# Patient Record
Sex: Male | Born: 1955
Health system: Southern US, Community
[De-identification: ages and names within clinical notes are randomized; demographics above are authoritative.]

## PROBLEM LIST (undated history)

## (undated) DIAGNOSIS — E039 Hypothyroidism, unspecified: Secondary | ICD-10-CM

## (undated) DIAGNOSIS — R030 Elevated blood-pressure reading, without diagnosis of hypertension: Secondary | ICD-10-CM

## (undated) DIAGNOSIS — E785 Hyperlipidemia, unspecified: Secondary | ICD-10-CM

## (undated) HISTORY — DX: Hyperlipidemia, unspecified: E78.5

## (undated) HISTORY — DX: Elevated blood-pressure reading, without diagnosis of hypertension: R03.0

## (undated) HISTORY — DX: Hypothyroidism, unspecified: E03.9

## (undated) HISTORY — PX: ELBOW ARTHROSCOPY: SHX614

---

## 1999-12-25 ENCOUNTER — Encounter: Payer: Self-pay | Admitting: Family Medicine

## 1999-12-25 ENCOUNTER — Ambulatory Visit (HOSPITAL_COMMUNITY): Admission: RE | Admit: 1999-12-25 | Discharge: 1999-12-25 | Payer: Self-pay | Admitting: Family Medicine

## 2006-06-27 ENCOUNTER — Ambulatory Visit: Payer: Self-pay | Admitting: Family Medicine

## 2006-06-27 ENCOUNTER — Encounter: Admission: RE | Admit: 2006-06-27 | Discharge: 2006-06-27 | Payer: Self-pay | Admitting: Family Medicine

## 2017-02-14 ENCOUNTER — Ambulatory Visit
Admission: RE | Admit: 2017-02-14 | Discharge: 2017-02-14 | Disposition: A | Discharge status: Home / Self Care | Payer: BC Managed Care – PPO | Source: Ambulatory Visit | Attending: Family Medicine | Admitting: Family Medicine

## 2017-02-14 ENCOUNTER — Other Ambulatory Visit: Payer: Self-pay | Admitting: Family Medicine

## 2017-02-14 DIAGNOSIS — M545 Low back pain: Secondary | ICD-10-CM

## 2018-07-25 IMAGING — CR DG LUMBAR SPINE COMPLETE 4+V
5 series · 5 of 5 positions shown · non-contrast
Comparison: No recent .

CLINICAL DATA: Right-sided low back pain .

EXAM:
LUMBAR SPINE - COMPLETE 4+ VIEW

[w lumbar spine ap]
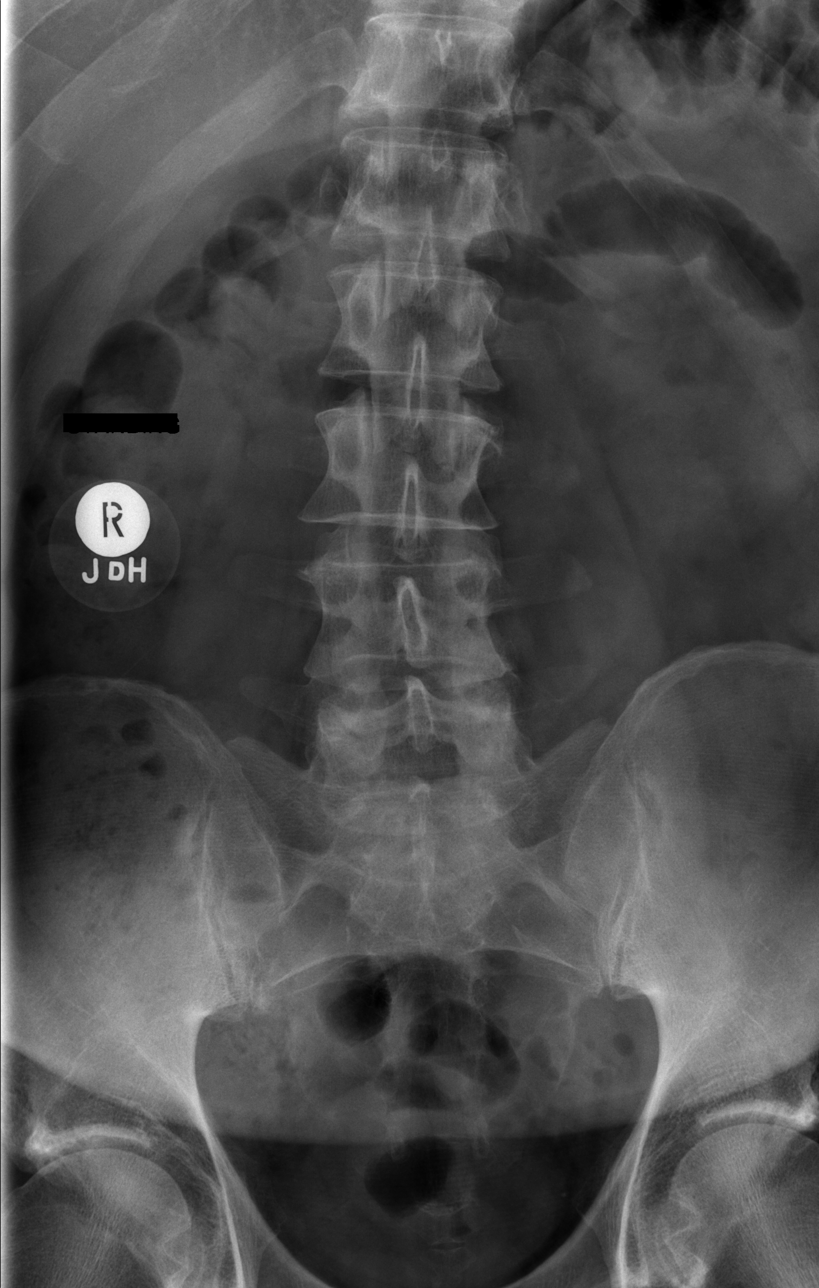

[w lumbar spine obl (1 of 2)]
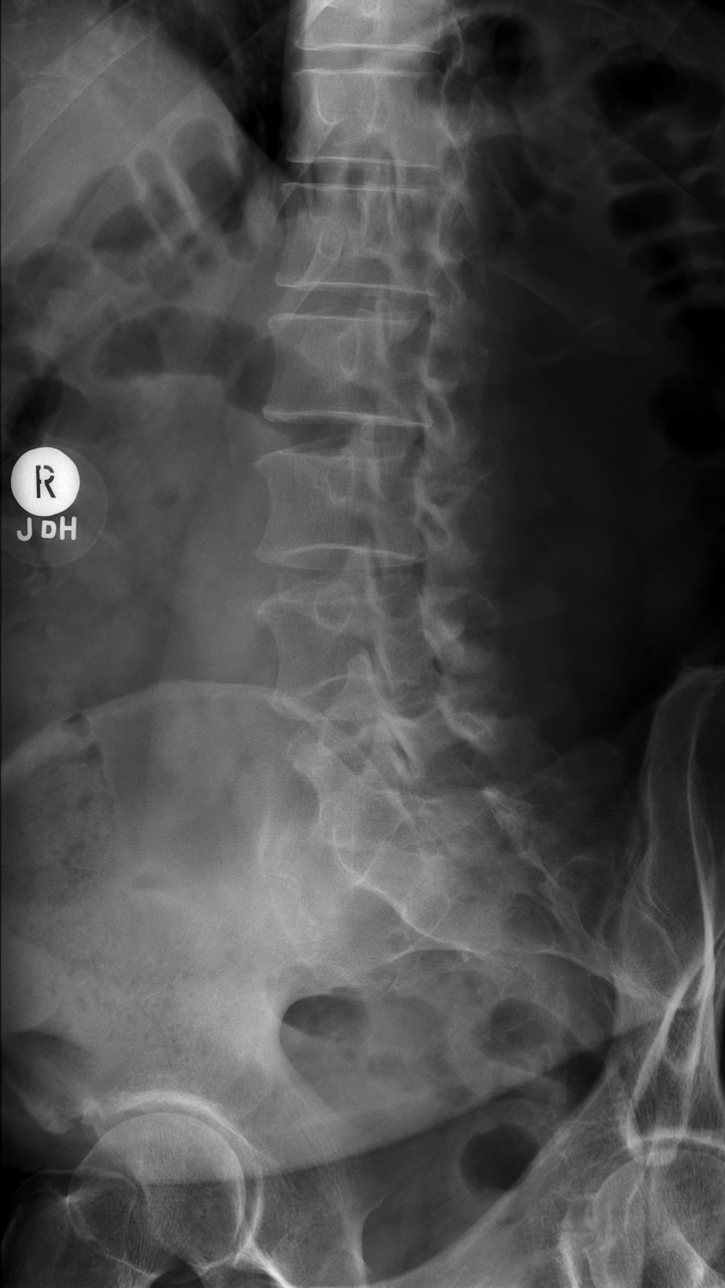

[w lumbar spine obl (2 of 2)]
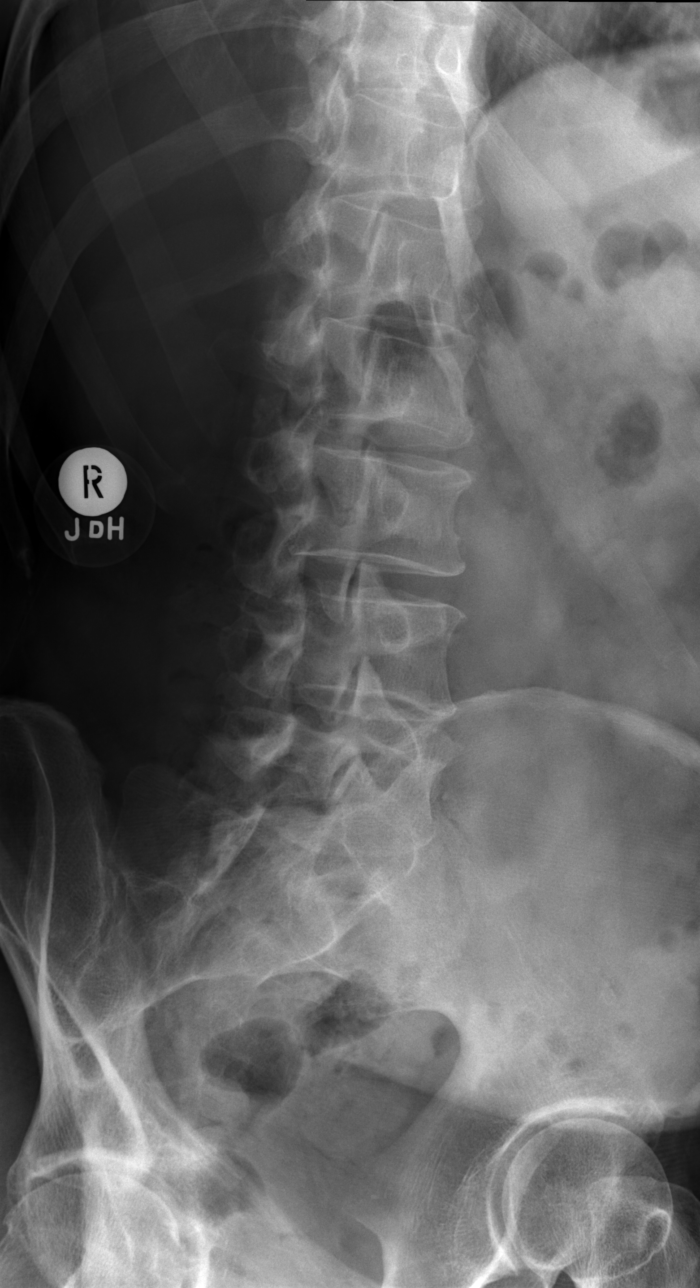

[w lumbar spine lat]
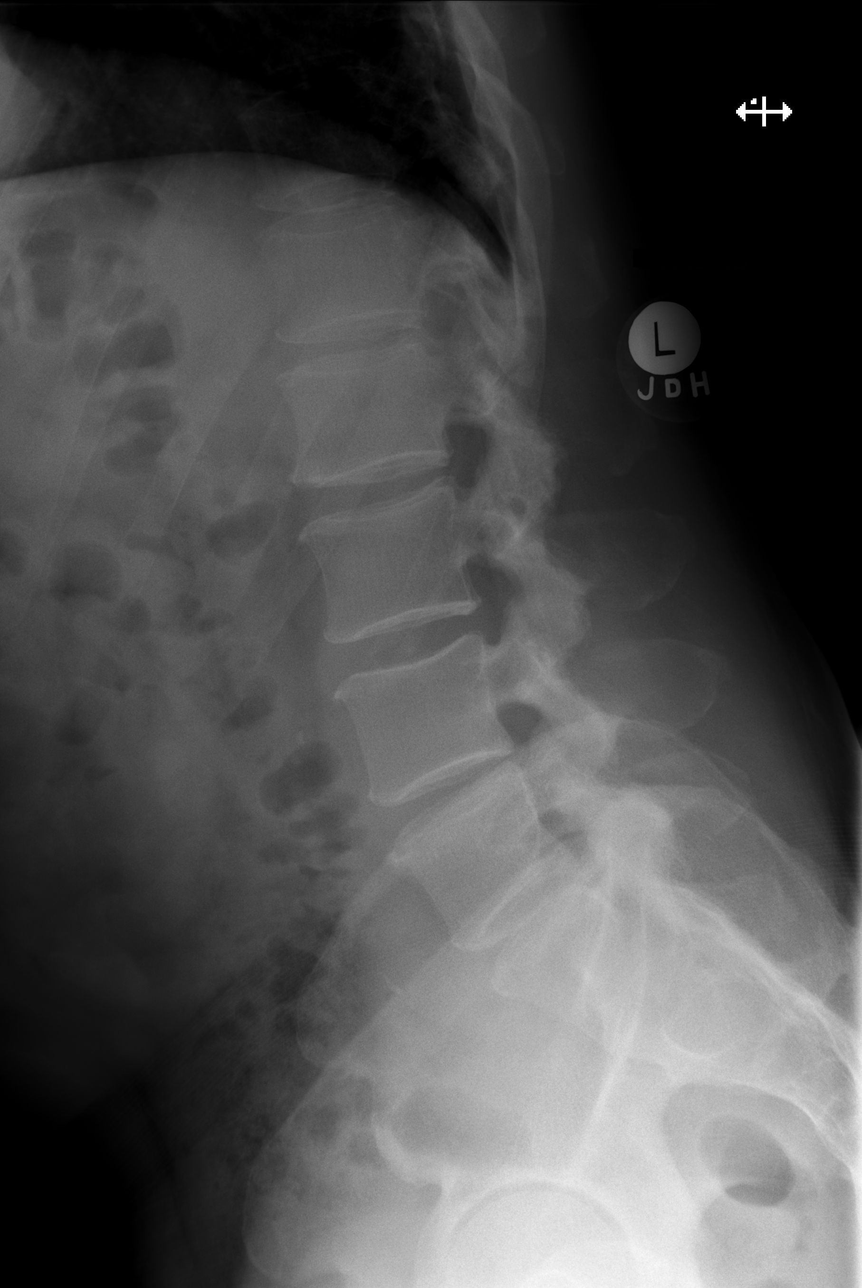

[w lumbar l-5 s-1 spot]
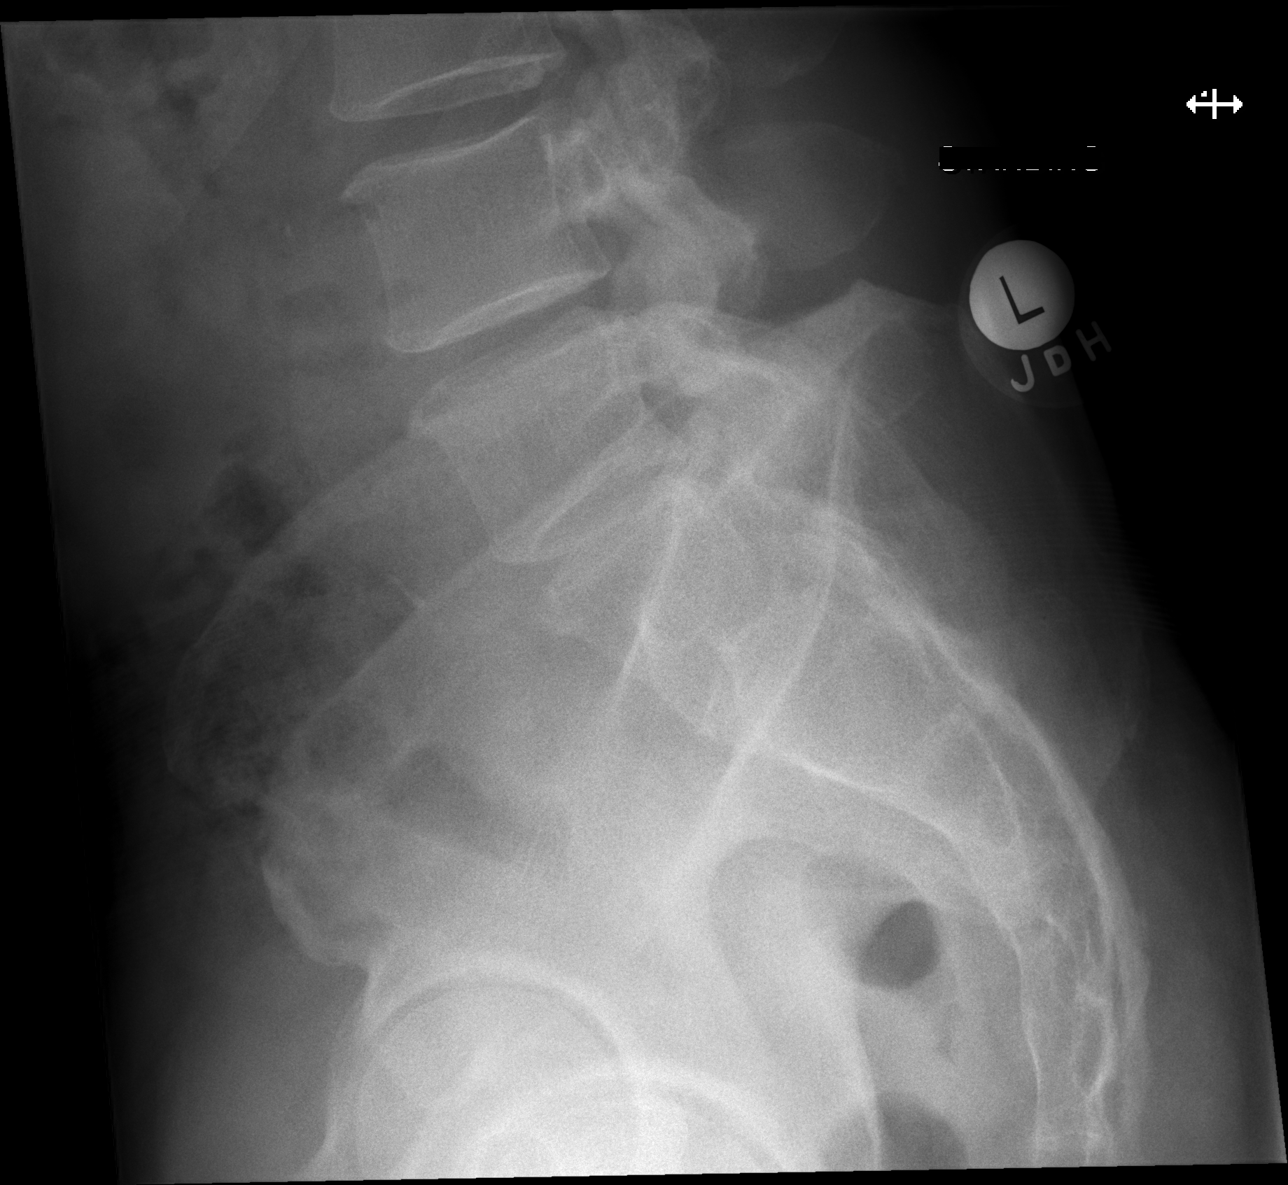

[5 of 5 positions shown; findings below may reference images not displayed]

FINDINGS: Degenerative changes lumbar spine with scoliosis concave left. No
acute bony abnormality identified. Degenerative changes both SI
joints and hips.
IMPRESSION: Diffuse mild degenerative change with mild lumbar scoliosis concave
left. No acute bony abnormality identified. No evidence of fracture.

## 2020-06-19 ENCOUNTER — Other Ambulatory Visit: Payer: Self-pay

## 2020-06-20 ENCOUNTER — Encounter: Payer: Self-pay | Admitting: Cardiology

## 2020-06-20 NOTE — Progress Notes (Signed)
Cardiology Office Note:    Date:  06/22/2020   ID:  Hunter Beasley, DOB January 09, 1956, MRN [id number]  PCP:  Hunter Lund, PA  Cardiologist:  Hunter Herrlich, MD   Referring MD: Hunter Lund, PA  ASSESSMENT:    1. Nonspecific abnormal electrocardiogram (ECG) (EKG)   2. Cardiac risk counseling   3. Hyperlipidemia, unspecified hyperlipidemia type    PLAN:    In order of problems listed above:  1. I reviewed with Hunter Beasley the overlap of normal and abnormal with nonspecific EKG changes advised him undergo echocardiogram to assess for cardiomyopathy and evidence of previous infarction not seen on today's EKG. 2. He is concerned about his cardiovascular risk I asked him to undergo coronary calcium score if it is severely elevated could trigger an ischemia evaluation with a score of greater than 300-400.  Otherwise continue his current medical regimen including lipid-lowering 3. Continue statin, if if his score is 0 he could discontinue.  I did tell him the subtle complaints of weakness in the legs could be related to his statin. 4. I am unsure about his nausea this morning it resolved quickly and did not recur after his initial entrance to the office.  Next appointment 6 weeks   Medication Adjustments/Labs and Tests Ordered: Current medicines are reviewed at length with the patient today.  Concerns regarding medicines are outlined above.  Orders Placed This Encounter  Procedures  . CT CARDIAC SCORING (SELF PAY ONLY)  . EKG 12-Lead  . ECHOCARDIOGRAM COMPLETE   No orders of the defined types were placed in this encounter.    Chief Complaint  Patient presents with  . Abnormal ECG    History of Present Illness:    Hunter Beasley is a 65 y.o. male who is being seen today for the evaluation of abnormal EKG at the request of Hunter Beasley, [place].  Recent labs 05/26/2020 shows a TSH mildly elevated 7.02 hemoglobin 16.4 platelets 157,000 creatinine 1.22 GFR 60 cc sodium 141 potassium 5.0  liver function test were normal lipid profile with a cholesterol 132 HDL 50 triglycerides 68 LDL 68  He is an Advertising account planner independent. He has a family history of CAD with father having bypass in his late 53s and both mother and sister with venous thromboembolism and sister died of a pulmonary embolism. He has no history of congenital rheumatic heart disease or heart murmur. The report of his EKG was possible previous anterior septal MI. Today's EKG is sinus rhythm with nonspecific T waves He is a referee basketball soccer and does not have exercise intolerance shortness of breath chest pain palpitation or syncope. Since taking a statin he has some leg fatigue and it takes him longer to recover after sports. He did not feel well during the night last night he was low lightheadedness came to our office was nauseous and vomited once he had no chest pain. His question to me is what is his cardiovascular risk.  Past Medical History:  Diagnosis Date  . Elevated blood pressure reading   . Hyperlipidemia   . Hypothyroidism     Past Surgical History:  Procedure Laterality Date  . ELBOW ARTHROSCOPY      Current Medications: Current Meds  Medication Sig  . atorvastatin (LIPITOR) 10 MG tablet Take 10 mg by mouth every other day.  . levothyroxine (SYNTHROID) 137 MCG tablet Take 137 mcg by mouth daily.  Marland Kitchen lidocaine (LIDODERM) 5 % 1 patch every 12 (twelve) hours as needed.  Marland Kitchen  naproxen sodium (ALEVE) 220 MG tablet Take 220 mg by mouth every 12 (twelve) hours as needed.  . [DISCONTINUED] levothyroxine (SYNTHROID) 112 MCG tablet Take 137 mcg by mouth in the morning.     Allergies:   Patient has no known allergies.   Social History   Socioeconomic History  . Marital status: Married    Spouse name: Not on file  . Number of children: Not on file  . Years of education: Not on file  . Highest education level: Not on file  Occupational History  . Not on file  Tobacco Use  . Smoking status:  Never Smoker  . Smokeless tobacco: Never Used  Vaping Use  . Vaping Use: Never used  Substance and Sexual Activity  . Alcohol use: Yes    Comment: Occasional  . Drug use: Never  . Sexual activity: Not on file  Other Topics Concern  . Not on file  Social History Narrative  . Not on file   Social Determinants of Health   Financial Resource Strain: Not on file  Food Insecurity: Not on file  Transportation Needs: Not on file  Physical Activity: Not on file  Stress: Not on file  Social Connections: Not on file     Family History: The patient's family history includes CAD in his brother and father; Clotting disorder in his mother; Heart attack in his maternal grandfather and maternal uncle; Hypertension in his father; Joint hypermobility in his brother; Lung cancer in his paternal grandfather; Pulmonary embolism in his sister; Stomach cancer in his paternal grandmother.  ROS:   ROS Please see the history of present illness.     All other systems reviewed and are negative.  EKGs/Labs/Other Studies Reviewed:    The following studies were reviewed today:   EKG:  EKG is  ordered today.  The ekg ordered today is personally reviewed and demonstrates sinus rhythm nonspecific T waves there is no pattern of infarction or acute ischemia    Physical Exam:    VS:  BP 108/70   Pulse 76   Ht 5\' 11"  (1.803 m)   Wt 202 lb 1.3 oz (91.7 kg)   SpO2 99%   BMI 28.18 kg/m     Wt Readings from Last 3 Encounters:  06/22/20 202 lb 1.3 oz (91.7 kg)     GEN: He appears healthy no xanthoma that the last month and symptom of nausea has resolved.  Well nourished, well developed in no acute distress HEENT: Normal NECK: No JVD; No carotid bruits LYMPHATICS: No lymphadenopathy CARDIAC: RRR, no murmurs, rubs, gallops RESPIRATORY:  Clear to auscultation without rales, wheezing or rhonchi  ABDOMEN: Soft, non-tender, non-distended MUSCULOSKELETAL:  No edema; No deformity  SKIN: Warm and  dry NEUROLOGIC:  Alert and oriented x 3 PSYCHIATRIC:  Normal affect     Signed, 06/24/20, MD  06/22/2020 9:33 AM    [facility]

## 2020-06-22 ENCOUNTER — Ambulatory Visit (INDEPENDENT_AMBULATORY_CARE_PROVIDER_SITE_OTHER): Payer: Medicare PPO | Admitting: Cardiology

## 2020-06-22 ENCOUNTER — Encounter: Payer: Self-pay | Admitting: Cardiology

## 2020-06-22 ENCOUNTER — Other Ambulatory Visit: Payer: Self-pay

## 2020-06-22 VITALS — BP 108/70 | HR 76 | Ht 71.0 in | Wt 202.1 lb

## 2020-06-22 DIAGNOSIS — Z7189 Other specified counseling: Secondary | ICD-10-CM | POA: Diagnosis not present

## 2020-06-22 DIAGNOSIS — E785 Hyperlipidemia, unspecified: Secondary | ICD-10-CM

## 2020-06-22 DIAGNOSIS — R9431 Abnormal electrocardiogram [ECG] [EKG]: Secondary | ICD-10-CM

## 2020-06-22 NOTE — Patient Instructions (Signed)
Medication Instructions:  No medication changes. *If you need a refill on your cardiac medications before your next appointment, please call your pharmacy*   Lab Work: None ordered If you have labs (blood work) drawn today and your tests are completely normal, you will receive your results only by: Marland Kitchen MyChart Message (if you have MyChart) OR . A paper copy in the mail If you have any lab test that is abnormal or we need to change your treatment, we will call you to review the results.   Testing/Procedures: We will order CT coronary calcium score. It will cost $99.00 and is not covered by insurance.  Please call [phone] to schedule.   [facility]  [address]  [place], [place] [address]  Your physician has requested that you have an echocardiogram. Echocardiography is a painless test that uses sound waves to create images of your heart. It provides your doctor with information about the size and shape of your heart and how well your heart's chambers and valves are working. This procedure takes approximately one hour. There are no restrictions for this procedure.    Follow-Up: At [facility], you and your health needs are our priority.  As part of our continuing mission to provide you with exceptional heart care, we have created designated Provider Care Teams.  These Care Teams include your primary Cardiologist (physician) and Advanced Practice Providers (APPs -  Physician Assistants and Nurse Practitioners) who all work together to provide you with the care you need, when you need it.  We recommend signing up for the patient portal called "MyChart".  Sign up information is provided on this After Visit Summary.  MyChart is used to connect with patients for Virtual Visits (Telemedicine).  Patients are able to view lab/test results, encounter notes, upcoming appointments, etc.  Non-urgent messages can be sent to your provider as well.   To learn more about what you can  do with MyChart, go to [email or web address].    Your next appointment:   6 week(s)  The format for your next appointment:   In Person  Provider:   Norman Herrlich, MD   Other Instructions  Echocardiogram An echocardiogram is a test that uses sound waves (ultrasound) to produce images of the heart. Images from an echocardiogram can provide important information about:  Heart size and shape.  The size and thickness and movement of your heart's walls.  Heart muscle function and strength.  Heart valve function or if you have stenosis. Stenosis is when the heart valves are too narrow.  If blood is flowing backward through the heart valves (regurgitation).  A tumor or infectious growth around the heart valves.  Areas of heart muscle that are not working well because of poor blood flow or injury from a heart attack.  Aneurysm detection. An aneurysm is a weak or damaged part of an artery wall. The wall bulges out from the normal force of blood pumping through the body. Tell a health care provider about:  Any allergies you have.  All medicines you are taking, including vitamins, herbs, eye drops, creams, and over-the-counter medicines.  Any blood disorders you have.  Any surgeries you have had.  Any medical conditions you have.  Whether you are pregnant or may be pregnant. What are the risks? Generally, this is a safe test. However, problems may occur, including an allergic reaction to dye (contrast) that may be used during the test. What happens before the test? No specific preparation is  needed. You may eat and drink normally. What happens during the test?  You will take off your clothes from the waist up and put on a hospital gown.  Electrodes or electrocardiogram (ECG)patches may be placed on your chest. The electrodes or patches are then connected to a device that monitors your heart rate and rhythm.  You will lie down on a table for an ultrasound exam. A gel will  be applied to your chest to help sound waves pass through your skin.  A handheld device, called a transducer, will be pressed against your chest and moved over your heart. The transducer produces sound waves that travel to your heart and bounce back (or "echo" back) to the transducer. These sound waves will be captured in real-time and changed into images of your heart that can be viewed on a video monitor. The images will be recorded on a computer and reviewed by your health care provider.  You may be asked to change positions or hold your breath for a short time. This makes it easier to get different views or better views of your heart.  In some cases, you may receive contrast through an IV in one of your veins. This can improve the quality of the pictures from your heart. The procedure may vary among health care providers and hospitals.   What can I expect after the test? You may return to your normal, everyday life, including diet, activities, and medicines, unless your health care provider tells you not to do that. Follow these instructions at home:  It is up to you to get the results of your test. Ask your health care provider, or the department that is doing the test, when your results will be ready.  Keep all follow-up visits. This is important. Summary  An echocardiogram is a test that uses sound waves (ultrasound) to produce images of the heart.  Images from an echocardiogram can provide important information about the size and shape of your heart, heart muscle function, heart valve function, and other possible heart problems.  You do not need to do anything to prepare before this test. You may eat and drink normally.  After the echocardiogram is completed, you may return to your normal, everyday life, unless your health care provider tells you not to do that. This information is not intended to replace advice given to you by your health care provider. Make sure you discuss any  questions you have with your health care provider. Document Revised: 12/03/2019 Document Reviewed: 12/03/2019 Elsevier Patient Education  2021 Elsevier Inc.  Coronary Calcium Scan A coronary calcium scan is an imaging test used to look for deposits of plaque in the inner lining of the blood vessels of the heart (coronary arteries). Plaque is made up of calcium, protein, and fatty substances. These deposits of plaque can partly clog and narrow the coronary arteries without producing any symptoms or warning signs. This puts a person at risk for a heart attack. This test is recommended for people who are at moderate risk for heart disease. The test can find plaque deposits before symptoms develop. Tell a health care provider about:  Any allergies you have.  All medicines you are taking, including vitamins, herbs, eye drops, creams, and over-the-counter medicines.  Any problems you or family members have had with anesthetic medicines.  Any blood disorders you have.  Any surgeries you have had.  Any medical conditions you have.  Whether you are pregnant or may be pregnant. What are  the risks? Generally, this is a safe procedure. However, problems may occur, including:  Harm to a pregnant woman and her unborn baby. This test involves the use of radiation. Radiation exposure can be dangerous to a pregnant woman and her unborn baby. If you are pregnant or think you may be pregnant, you should not have this procedure done.  Slight increase in the risk of cancer. This is because of the radiation involved in the test. What happens before the procedure? Ask your health care provider for any specific instructions on how to prepare for this procedure. You may be asked to avoid products that contain caffeine, tobacco, or nicotine for 4 hours before the procedure. What happens during the procedure?  You will undress and remove any jewelry from your neck or chest.  You will put on a hospital  gown.  Sticky electrodes will be placed on your chest. The electrodes will be connected to an electrocardiogram (ECG) machine to record a tracing of the electrical activity of your heart.  You will lie down on a curved bed that is attached to the CT scanner.  You may be given medicine to slow down your heart rate so that clear pictures can be created.  You will be moved into the CT scanner, and the CT scanner will take pictures of your heart. During this time, you will be asked to lie still and hold your breath for 2-3 seconds at a time while each picture of your heart is being taken. The procedure may vary among health care providers and hospitals.   What happens after the procedure?  You can get dressed.  You can return to your normal activities.  It is up to you to get the results of your procedure. Ask your health care provider, or the department that is doing the procedure, when your results will be ready. Summary  A coronary calcium scan is an imaging test used to look for deposits of plaque in the inner lining of the blood vessels of the heart (coronary arteries). Plaque is made up of calcium, protein, and fatty substances.  Generally, this is a safe procedure. Tell your health care provider if you are pregnant or may be pregnant.  Ask your health care provider for any specific instructions on how to prepare for this procedure.  A CT scanner will take pictures of your heart.  You can return to your normal activities after the scan is done. This information is not intended to replace advice given to you by your health care provider. Make sure you discuss any questions you have with your health care provider. Document Revised: 10/30/2018 Document Reviewed: 10/30/2018 Elsevier Patient Education  2021 [employer].

## 2020-07-24 ENCOUNTER — Telehealth: Payer: Self-pay

## 2020-07-24 ENCOUNTER — Ambulatory Visit (INDEPENDENT_AMBULATORY_CARE_PROVIDER_SITE_OTHER)
Admission: RE | Admit: 2020-07-24 | Discharge: 2020-07-24 | Disposition: A | Discharge status: Home / Self Care | Payer: Self-pay | Source: Ambulatory Visit | Attending: Cardiology | Admitting: Cardiology

## 2020-07-24 ENCOUNTER — Other Ambulatory Visit: Payer: Self-pay

## 2020-07-24 DIAGNOSIS — R9431 Abnormal electrocardiogram [ECG] [EKG]: Secondary | ICD-10-CM

## 2020-07-24 DIAGNOSIS — E785 Hyperlipidemia, unspecified: Secondary | ICD-10-CM

## 2020-07-24 DIAGNOSIS — Z7189 Other specified counseling: Secondary | ICD-10-CM

## 2020-07-24 NOTE — Telephone Encounter (Signed)
-----   Message from Baldo Daub, MD sent at 07/24/2020 11:59 AM EDT ----- His calcium score is relatively low 8 27th percentile for age and sex  I would tell him he should remain on statin  Generally score is low like this or not associated with significant blockage of arteries  I do not think he needs further testing like a CTA at this time.

## 2020-07-24 NOTE — Telephone Encounter (Signed)
Pt is returning call.  

## 2020-07-24 NOTE — Telephone Encounter (Signed)
Spoke with patient regarding results and recommendation.  Patient verbalizes understanding and is agreeable to plan of care. Advised patient to call back with any issues or concerns.  

## 2020-07-24 NOTE — Telephone Encounter (Signed)
Left message on patients voicemail to please return our call.   

## 2020-07-27 ENCOUNTER — Ambulatory Visit (HOSPITAL_BASED_OUTPATIENT_CLINIC_OR_DEPARTMENT_OTHER)
Admission: RE | Admit: 2020-07-27 | Discharge: 2020-07-27 | Disposition: A | Discharge status: Home / Self Care | Payer: Medicare PPO | Source: Ambulatory Visit | Attending: Cardiology | Admitting: Cardiology

## 2020-07-27 ENCOUNTER — Other Ambulatory Visit: Payer: Self-pay

## 2020-07-27 DIAGNOSIS — E785 Hyperlipidemia, unspecified: Secondary | ICD-10-CM | POA: Diagnosis not present

## 2020-07-27 DIAGNOSIS — R9431 Abnormal electrocardiogram [ECG] [EKG]: Secondary | ICD-10-CM

## 2020-07-27 LAB — ECHOCARDIOGRAM COMPLETE
Area-P 1/2: 3.93 cm2
S' Lateral: 2.98 cm

## 2020-07-28 ENCOUNTER — Telehealth: Payer: Self-pay

## 2020-07-28 DIAGNOSIS — H52203 Unspecified astigmatism, bilateral: Secondary | ICD-10-CM | POA: Diagnosis not present

## 2020-07-28 DIAGNOSIS — H1789 Other corneal scars and opacities: Secondary | ICD-10-CM | POA: Diagnosis not present

## 2020-07-28 DIAGNOSIS — H524 Presbyopia: Secondary | ICD-10-CM | POA: Diagnosis not present

## 2020-07-28 NOTE — Telephone Encounter (Signed)
Spoke with patient regarding results and recommendation.  Patient verbalizes understanding and is agreeable to plan of care. Advised patient to call back with any issues or concerns.  

## 2020-07-28 NOTE — Telephone Encounter (Signed)
Left message on patients voicemail to please return our call.   

## 2020-08-04 ENCOUNTER — Ambulatory Visit: Payer: Medicare PPO | Admitting: Cardiology

## 2020-08-06 ENCOUNTER — Ambulatory Visit: Payer: Medicare PPO | Admitting: Cardiology

## 2020-09-24 DIAGNOSIS — E039 Hypothyroidism, unspecified: Secondary | ICD-10-CM | POA: Diagnosis not present

## 2020-11-02 DIAGNOSIS — R55 Syncope and collapse: Secondary | ICD-10-CM | POA: Diagnosis not present

## 2020-11-02 DIAGNOSIS — E785 Hyperlipidemia, unspecified: Secondary | ICD-10-CM | POA: Diagnosis not present

## 2020-11-02 DIAGNOSIS — E11311 Type 2 diabetes mellitus with unspecified diabetic retinopathy with macular edema: Secondary | ICD-10-CM | POA: Diagnosis not present

## 2020-11-18 DIAGNOSIS — L821 Other seborrheic keratosis: Secondary | ICD-10-CM | POA: Diagnosis not present

## 2020-11-18 DIAGNOSIS — L578 Other skin changes due to chronic exposure to nonionizing radiation: Secondary | ICD-10-CM | POA: Diagnosis not present

## 2020-11-18 DIAGNOSIS — L570 Actinic keratosis: Secondary | ICD-10-CM | POA: Diagnosis not present

## 2020-11-18 DIAGNOSIS — D1801 Hemangioma of skin and subcutaneous tissue: Secondary | ICD-10-CM | POA: Diagnosis not present

## 2020-12-07 DIAGNOSIS — H43811 Vitreous degeneration, right eye: Secondary | ICD-10-CM | POA: Diagnosis not present

## 2020-12-29 DIAGNOSIS — H43811 Vitreous degeneration, right eye: Secondary | ICD-10-CM | POA: Diagnosis not present

## 2021-01-30 DIAGNOSIS — Z791 Long term (current) use of non-steroidal anti-inflammatories (NSAID): Secondary | ICD-10-CM | POA: Diagnosis not present

## 2021-01-30 DIAGNOSIS — Z8249 Family history of ischemic heart disease and other diseases of the circulatory system: Secondary | ICD-10-CM | POA: Diagnosis not present

## 2021-01-30 DIAGNOSIS — M1990 Unspecified osteoarthritis, unspecified site: Secondary | ICD-10-CM | POA: Diagnosis not present

## 2021-01-30 DIAGNOSIS — R030 Elevated blood-pressure reading, without diagnosis of hypertension: Secondary | ICD-10-CM | POA: Diagnosis not present

## 2021-01-30 DIAGNOSIS — E039 Hypothyroidism, unspecified: Secondary | ICD-10-CM | POA: Diagnosis not present

## 2021-01-30 DIAGNOSIS — E785 Hyperlipidemia, unspecified: Secondary | ICD-10-CM | POA: Diagnosis not present

## 2021-03-19 DIAGNOSIS — Z03818 Encounter for observation for suspected exposure to other biological agents ruled out: Secondary | ICD-10-CM | POA: Diagnosis not present

## 2021-03-19 DIAGNOSIS — R5383 Other fatigue: Secondary | ICD-10-CM | POA: Diagnosis not present

## 2021-03-19 DIAGNOSIS — J069 Acute upper respiratory infection, unspecified: Secondary | ICD-10-CM | POA: Diagnosis not present

## 2021-03-19 DIAGNOSIS — R6883 Chills (without fever): Secondary | ICD-10-CM | POA: Diagnosis not present

## 2021-05-28 DIAGNOSIS — E782 Mixed hyperlipidemia: Secondary | ICD-10-CM | POA: Diagnosis not present

## 2021-05-28 DIAGNOSIS — Z0000 Encounter for general adult medical examination without abnormal findings: Secondary | ICD-10-CM | POA: Diagnosis not present

## 2021-05-28 DIAGNOSIS — E039 Hypothyroidism, unspecified: Secondary | ICD-10-CM | POA: Diagnosis not present

## 2021-05-28 DIAGNOSIS — N182 Chronic kidney disease, stage 2 (mild): Secondary | ICD-10-CM | POA: Diagnosis not present

## 2021-05-28 DIAGNOSIS — Z125 Encounter for screening for malignant neoplasm of prostate: Secondary | ICD-10-CM | POA: Diagnosis not present

## 2021-07-29 DIAGNOSIS — H43811 Vitreous degeneration, right eye: Secondary | ICD-10-CM | POA: Diagnosis not present

## 2021-07-29 DIAGNOSIS — H1789 Other corneal scars and opacities: Secondary | ICD-10-CM | POA: Diagnosis not present

## 2021-07-29 DIAGNOSIS — H52203 Unspecified astigmatism, bilateral: Secondary | ICD-10-CM | POA: Diagnosis not present

## 2021-07-29 DIAGNOSIS — H2513 Age-related nuclear cataract, bilateral: Secondary | ICD-10-CM | POA: Diagnosis not present

## 2021-11-22 DIAGNOSIS — U071 COVID-19: Secondary | ICD-10-CM | POA: Diagnosis not present

## 2022-01-18 DIAGNOSIS — J310 Chronic rhinitis: Secondary | ICD-10-CM | POA: Diagnosis not present

## 2022-01-18 DIAGNOSIS — J342 Deviated nasal septum: Secondary | ICD-10-CM | POA: Diagnosis not present

## 2022-01-18 DIAGNOSIS — J343 Hypertrophy of nasal turbinates: Secondary | ICD-10-CM | POA: Diagnosis not present

## 2022-01-18 DIAGNOSIS — R0683 Snoring: Secondary | ICD-10-CM | POA: Diagnosis not present

## 2022-02-23 DIAGNOSIS — L814 Other melanin hyperpigmentation: Secondary | ICD-10-CM | POA: Diagnosis not present

## 2022-02-23 DIAGNOSIS — L578 Other skin changes due to chronic exposure to nonionizing radiation: Secondary | ICD-10-CM | POA: Diagnosis not present

## 2022-02-23 DIAGNOSIS — D225 Melanocytic nevi of trunk: Secondary | ICD-10-CM | POA: Diagnosis not present

## 2022-02-23 DIAGNOSIS — E663 Overweight: Secondary | ICD-10-CM | POA: Diagnosis not present

## 2022-02-23 DIAGNOSIS — L570 Actinic keratosis: Secondary | ICD-10-CM | POA: Diagnosis not present

## 2022-02-23 DIAGNOSIS — D485 Neoplasm of uncertain behavior of skin: Secondary | ICD-10-CM | POA: Diagnosis not present

## 2022-02-24 DIAGNOSIS — J343 Hypertrophy of nasal turbinates: Secondary | ICD-10-CM | POA: Diagnosis not present

## 2022-02-24 DIAGNOSIS — J342 Deviated nasal septum: Secondary | ICD-10-CM | POA: Diagnosis not present

## 2022-06-03 DIAGNOSIS — E039 Hypothyroidism, unspecified: Secondary | ICD-10-CM | POA: Diagnosis not present

## 2022-06-03 DIAGNOSIS — Z0000 Encounter for general adult medical examination without abnormal findings: Secondary | ICD-10-CM | POA: Diagnosis not present

## 2022-06-03 DIAGNOSIS — Z125 Encounter for screening for malignant neoplasm of prostate: Secondary | ICD-10-CM | POA: Diagnosis not present

## 2022-06-03 DIAGNOSIS — E782 Mixed hyperlipidemia: Secondary | ICD-10-CM | POA: Diagnosis not present

## 2022-08-17 DIAGNOSIS — H52203 Unspecified astigmatism, bilateral: Secondary | ICD-10-CM | POA: Diagnosis not present

## 2022-08-17 DIAGNOSIS — H43811 Vitreous degeneration, right eye: Secondary | ICD-10-CM | POA: Diagnosis not present

## 2022-08-17 DIAGNOSIS — H2513 Age-related nuclear cataract, bilateral: Secondary | ICD-10-CM | POA: Diagnosis not present

## 2022-08-17 DIAGNOSIS — H1789 Other corneal scars and opacities: Secondary | ICD-10-CM | POA: Diagnosis not present

## 2023-03-20 DIAGNOSIS — L820 Inflamed seborrheic keratosis: Secondary | ICD-10-CM | POA: Diagnosis not present

## 2023-03-20 DIAGNOSIS — L570 Actinic keratosis: Secondary | ICD-10-CM | POA: Diagnosis not present

## 2023-03-20 DIAGNOSIS — L578 Other skin changes due to chronic exposure to nonionizing radiation: Secondary | ICD-10-CM | POA: Diagnosis not present

## 2023-03-20 DIAGNOSIS — L821 Other seborrheic keratosis: Secondary | ICD-10-CM | POA: Diagnosis not present

## 2023-03-20 DIAGNOSIS — D225 Melanocytic nevi of trunk: Secondary | ICD-10-CM | POA: Diagnosis not present

## 2023-06-14 DIAGNOSIS — E782 Mixed hyperlipidemia: Secondary | ICD-10-CM | POA: Diagnosis not present

## 2023-06-14 DIAGNOSIS — Z1211 Encounter for screening for malignant neoplasm of colon: Secondary | ICD-10-CM | POA: Diagnosis not present

## 2023-06-14 DIAGNOSIS — E039 Hypothyroidism, unspecified: Secondary | ICD-10-CM | POA: Diagnosis not present

## 2023-06-14 DIAGNOSIS — T148XXA Other injury of unspecified body region, initial encounter: Secondary | ICD-10-CM | POA: Diagnosis not present

## 2023-06-14 DIAGNOSIS — Z0000 Encounter for general adult medical examination without abnormal findings: Secondary | ICD-10-CM | POA: Diagnosis not present

## 2023-08-30 DIAGNOSIS — H1789 Other corneal scars and opacities: Secondary | ICD-10-CM | POA: Diagnosis not present

## 2023-08-30 DIAGNOSIS — H2513 Age-related nuclear cataract, bilateral: Secondary | ICD-10-CM | POA: Diagnosis not present

## 2023-09-01 DIAGNOSIS — Z1211 Encounter for screening for malignant neoplasm of colon: Secondary | ICD-10-CM | POA: Diagnosis not present

## 2023-10-12 DIAGNOSIS — L570 Actinic keratosis: Secondary | ICD-10-CM | POA: Diagnosis not present

## 2023-10-12 DIAGNOSIS — L820 Inflamed seborrheic keratosis: Secondary | ICD-10-CM | POA: Diagnosis not present

## 2024-01-29 ENCOUNTER — Ambulatory Visit (HOSPITAL_BASED_OUTPATIENT_CLINIC_OR_DEPARTMENT_OTHER): Admitting: Cardiology

## 2024-03-14 ENCOUNTER — Encounter (HOSPITAL_BASED_OUTPATIENT_CLINIC_OR_DEPARTMENT_OTHER): Payer: Self-pay | Admitting: Cardiology

## 2024-03-14 ENCOUNTER — Ambulatory Visit (HOSPITAL_BASED_OUTPATIENT_CLINIC_OR_DEPARTMENT_OTHER): Admitting: Cardiology

## 2024-03-14 VITALS — BP 136/82 | HR 50 | Ht 71.0 in | Wt 207.8 lb

## 2024-03-14 DIAGNOSIS — Z7189 Other specified counseling: Secondary | ICD-10-CM | POA: Diagnosis not present

## 2024-03-14 DIAGNOSIS — E7800 Pure hypercholesterolemia, unspecified: Secondary | ICD-10-CM | POA: Diagnosis not present

## 2024-03-14 DIAGNOSIS — Z8249 Family history of ischemic heart disease and other diseases of the circulatory system: Secondary | ICD-10-CM | POA: Diagnosis not present

## 2024-03-14 DIAGNOSIS — I2510 Atherosclerotic heart disease of native coronary artery without angina pectoris: Secondary | ICD-10-CM | POA: Diagnosis not present

## 2024-03-14 NOTE — Progress Notes (Signed)
 Cardiology Office Note:  .   Date:  03/14/2024  ID:  Hunter Beasley, DOB Apr 29, 1955, MRN [id number] PCP: Alben Therisa MATSU, PA  [facility] Providers Cardiologist:  Shelda Bruckner, MD {  History of Present Illness: Hunter   Hunter Beasley is a 68 y.o. male with PMH hyperlipidemia, family history of heart disease. He is seen as a new patient evaluation to re-establish care with cardiology.  Pertinent CV history: Seen once by Dr. Monetta in 05/2020 for abnormal ECG. Echo in 2022 showed normal LVEF, G1DD, no significant valve disease, normal RV and pressures. Calcium score 2022 was 8.31 (27th %ile). FH: father with CABG in his 12s, still living age 79, mother and sister with history of VTE, sister died of PE. Brother had stent placed when he was 55.  Today: No specific concerns today. Wife sees me, he was told by Dr. Monetta he should check back in a few years. We reviewed his prior testing and workup.  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 Can run 4-5 miles a day.   He has never had cardiac event. Takes atorvastatin every other day or so. Sees team at [facility], per [facility] lipids 05/2023 Tchol 157, HDL 54, LDL 88, TG 74.  ROS: Denies chest pain, shortness of breath at rest or with normal exertion. No PND, orthopnea, LE edema or unexpected weight gain. No syncope or palpitations. ROS otherwise negative except as noted.   Studies Reviewed: Hunter    EKG:  EKG Interpretation Date/Time:  Thursday March 14 2024 11:15:28 EST Ventricular Rate:  50 PR Interval:  160 QRS Duration:  92 QT Interval:  406 QTC Calculation: 370 R Axis:   19  Text Interpretation: Sinus bradycardia Nonspecific T wave abnormality Confirmed by Bruckner Shelda [phone]) on 03/14/2024 11:56:13 AM    Physical Exam:   VS:  BP 136/82 (BP Location: Right Arm, Patient Position: Sitting, Cuff Size: Normal)   Pulse (!) 50   Ht 5' 11 (1.803 m)   Wt 207 lb 12.8 oz (94.3 kg)   SpO2  97%   BMI 28.98 kg/m    Wt Readings from Last 3 Encounters:  03/14/24 207 lb 12.8 oz (94.3 kg)  06/22/20 202 lb 1.3 oz (91.7 kg)    GEN: Well nourished, well developed in no acute distress HEENT: Normal, moist mucous membranes NECK: No JVD CARDIAC: regular rhythm, normal S1 and S2, no rubs or gallops. No murmur. VASCULAR: Radial and DP pulses 2+ bilaterally. No carotid bruits RESPIRATORY:  Clear to auscultation without rales, wheezing or rhonchi  ABDOMEN: Soft, non-tender, non-distended MUSCULOSKELETAL:  Ambulates independently SKIN: Warm and dry, no edema NEUROLOGIC:  Alert and oriented x 3. No focal neuro deficits noted. PSYCHIATRIC:  Normal affect    ASSESSMENT AND PLAN: .    Coronary artery calcification, consistent with minimal nonobstructive CAD Hypercholesterolemia Family history of CAD (not premature) -reviewed calcium score of 8, consistent with minimal nonobstructive CAD -very active, no symptoms -lipids reviewed, see HPI. LDL 88. Discussed goal of <70. He is taking every other day, 10 mg atorvastatin. He will change to taking every day, has his physical scheduled in about 3 mos, and they will check at that time -reviewed diet, lifestyle recommendations as below -reviewed red flag warning signs that need immediate medical attention  CV risk counseling and prevention -recommend heart healthy/Mediterranean diet, with whole grains, fruits, vegetable, fish, lean meats, nuts, and olive oil. Limit salt. -recommend moderate  walking, 3-5 times/week for 30-50 minutes each session. Aim for at least 150 minutes/week. Goal should be pace of 3 miles/hours, or walking 1.5 miles in 30 minutes -recommend avoidance of tobacco products. Avoid excess alcohol.  Dispo: I would be happy to see him back as needed  Signed, Shelda Bruckner, MD   Shelda Bruckner, MD, PhD, [facility]  [facility]  [place]  Heart & Vascular at [facility] [address] [place], [place] [address] [phone]

## 2024-03-14 NOTE — Patient Instructions (Signed)
 Medication Instructions:   Your physician recommends that you continue on your current medications as directed. Please refer to the Current Medication list given to you today.      Follow-Up:  As needed with Dr. Lonni, Reche Finder, NP or Rosaline Bane, NP

## 2024-03-27 DIAGNOSIS — W908XXS Exposure to other nonionizing radiation, sequela: Secondary | ICD-10-CM | POA: Diagnosis not present

## 2024-03-27 DIAGNOSIS — L570 Actinic keratosis: Secondary | ICD-10-CM | POA: Diagnosis not present

## 2024-03-27 DIAGNOSIS — L578 Other skin changes due to chronic exposure to nonionizing radiation: Secondary | ICD-10-CM | POA: Diagnosis not present

## 2024-03-27 DIAGNOSIS — D225 Melanocytic nevi of trunk: Secondary | ICD-10-CM | POA: Diagnosis not present

## 2024-03-27 DIAGNOSIS — L821 Other seborrheic keratosis: Secondary | ICD-10-CM | POA: Diagnosis not present

## 2024-05-29 ENCOUNTER — Encounter (HOSPITAL_BASED_OUTPATIENT_CLINIC_OR_DEPARTMENT_OTHER): Payer: Self-pay | Admitting: Cardiology
